# Patient Record
Sex: Male | Born: 1970 | Race: Black or African American | Hispanic: No | Marital: Married | Smoking: Former smoker
Health system: Southern US, Community
[De-identification: ages and names within clinical notes are randomized; demographics above are authoritative.]

## PROBLEM LIST (undated history)

## (undated) DIAGNOSIS — E119 Type 2 diabetes mellitus without complications: Secondary | ICD-10-CM

## (undated) HISTORY — PX: VASECTOMY: SHX75

## (undated) HISTORY — PX: EYE SURGERY: SHX253

---

## 2007-02-26 ENCOUNTER — Encounter: Admission: RE | Admit: 2007-02-26 | Discharge: 2007-02-26 | Payer: Self-pay | Admitting: *Deleted

## 2015-10-07 ENCOUNTER — Ambulatory Visit: Payer: Self-pay | Admitting: Endocrinology

## 2020-01-08 ENCOUNTER — Ambulatory Visit: Payer: Self-pay | Attending: Internal Medicine

## 2020-01-08 DIAGNOSIS — Z23 Encounter for immunization: Secondary | ICD-10-CM

## 2020-01-08 NOTE — Progress Notes (Signed)
   Covid-19 Vaccination Clinic  Name:  Angeles Paolucci    MRN: [id number] DOB: 04/09/1971  01/08/2020  Mr. Avakian was observed post Covid-19 immunization for 15 minutes without incident. He was provided with Vaccine Information Sheet and instruction to access the V-Safe system.   Mr. Bobier was instructed to call 911 with any severe reactions post vaccine: Marland Kitchen Difficulty breathing  . Swelling of face and throat  . A fast heartbeat  . A bad rash all over body  . Dizziness and weakness   Immunizations Administered    Name Date Dose VIS Date Route   Pfizer COVID-19 Vaccine 01/08/2020  3:04 PM 0.3 mL 09/26/2019 Intramuscular   Manufacturer: [employer], [employer]   Lot: [id number]   NDC: [address]-2

## 2020-01-20 ENCOUNTER — Encounter (INDEPENDENT_AMBULATORY_CARE_PROVIDER_SITE_OTHER): Payer: Self-pay | Admitting: Ophthalmology

## 2020-01-22 ENCOUNTER — Encounter (INDEPENDENT_AMBULATORY_CARE_PROVIDER_SITE_OTHER): Payer: Self-pay | Admitting: Ophthalmology

## 2020-01-22 ENCOUNTER — Ambulatory Visit (INDEPENDENT_AMBULATORY_CARE_PROVIDER_SITE_OTHER): Payer: BC Managed Care – PPO | Admitting: Ophthalmology

## 2020-01-22 ENCOUNTER — Other Ambulatory Visit: Payer: Self-pay

## 2020-01-22 DIAGNOSIS — H359 Unspecified retinal disorder: Secondary | ICD-10-CM | POA: Diagnosis not present

## 2020-01-22 DIAGNOSIS — E113411 Type 2 diabetes mellitus with severe nonproliferative diabetic retinopathy with macular edema, right eye: Secondary | ICD-10-CM | POA: Diagnosis not present

## 2020-01-22 DIAGNOSIS — H2513 Age-related nuclear cataract, bilateral: Secondary | ICD-10-CM | POA: Diagnosis not present

## 2020-01-22 DIAGNOSIS — E113412 Type 2 diabetes mellitus with severe nonproliferative diabetic retinopathy with macular edema, left eye: Secondary | ICD-10-CM | POA: Diagnosis not present

## 2020-01-22 MED ORDER — BEVACIZUMAB CHEMO INJECTION 1.25MG/0.05ML SYRINGE FOR KALEIDOSCOPE
1.2500 mg | INTRAVITREAL | Status: AC | PRN
  Administered 2020-01-22: 16:00:00 1.25 mg via INTRAVITREAL

## 2020-01-22 NOTE — Patient Instructions (Signed)
The nature of diabetic macular edema was discussed with the patient. Treatment options were outlined including medical therapy, laser & vitrectomy. The use of injectable medications reviewed, including Avastin, Lucentis, and Eylea. Periodic injections into the eye are likely to resolve diabetic macular edema (swelling in the center of vision). Initially, injections are delivered are delivered every 4-6 weeks, and the interval extended as the condition improves. On average, 8-9 injections the first year, and 5 in year 2. Improvement in the condition most often improves on medical therapy. Occasional use of focal laser is also recommended for residual macular edema (swelling). Excellent control of blood glucose and blood pressure are encouraged under the care of a primary physician or endocrinologist. Similarly, attempts to maintain serum cholesterol, low density lipoproteins, and high-density lipoproteins in a favorable range were recommended.  Diabetic Retinopathy Diabetic retinopathy is a disease of the retina. The retina is a light-sensitive membrane at the back of the eye. Retinopathy is a complication of diabetes (diabetes mellitus) and a common cause of bad eyesight (visual impairment). It can eventually cause blindness. Early detection and treatment of diabetic retinopathy is important in keeping your eyes healthy and preventing further damage to them. What are the causes? Diabetic retinopathy is caused by blood sugar (glucose) levels that are too high for an extended period of time. High blood glucose over an extended period of time can:  Damage small blood vessels in the retina, allowing blood to leak through the vessel walls.  Cause new, abnormal blood vessels to grow on the retina. This can scar the retina in the advanced stage of diabetic retinopathy. What increases the risk? You are more likely to develop this condition if:  You have had diabetes for a long time.  You have poorly  controlled blood glucose.  You have high blood pressure. What are the signs or symptoms? In the early stages of diabetic retinopathy, there are often no symptoms. As the condition gets worse, symptoms may include:  Blurred vision. This is usually caused by swelling due to abnormal blood glucose levels. The blurriness may go away when blood glucose levels return to normal.  Moving specks or dark spots (floaters) in your vision. These can be caused by a small amount of bleeding (hemorrhage) from retinal blood vessels.  Missing parts of your field of vision, such as vision at the sides of the eyes. This can be caused by larger retinal hemorrhages.  Difficulty reading.  Double vision.  Pain in one or both eyes.  Feeling pressure in one or both eyes.  Trouble seeing straight lines. Straight lines may not look straight.  Redness of the eyes that does not go away. How is this diagnosed? This condition may be diagnosed with an eye exam in which your eye care specialist puts drops in your eyes that enlarge (dilate) your pupils. This lets your health care provider examine your retina and check for changes in your retinal blood vessels. How is this treated? This condition may be treated by:  Keeping your blood glucose and blood pressure within a target range.  Using a type of laser beam to seal your retinal blood vessels. This stops them from bleeding and decreases pressure in your eye.  Getting shots of medicine in the eye to reduce swelling of the center of the retina (macula). You may be given: ? Anti-VEGF medicine. This medicine can help slow vision loss, and may even improve vision. ? Steroid medicine. Follow these instructions at home:   Follow your diabetes   management plan as directed by your health care provider. This may include exercising regularly and eating a healthy diet.  Keep your blood glucose level and your blood pressure in your target range, as directed by your health  care provider.  Check your blood glucose as often as directed.  Take over the counter and prescription medicines only as told by your health care provider. This includes insulin and oral diabetes medicine.  Get your eyes checked at least once every year. An eye specialist can usually see diabetic retinopathy developing long before it starts to cause problems. In many cases, it can be treated to prevent complications from occurring.  Do not use any products that contain nicotine or tobacco, such as cigarettes and e-cigarettes. If you need help quitting, ask your health care provider.  Keep all follow-up visits as told by your health care provider. This is important. Contact a health care provider if:  You notice gradual blurring or other changes in your vision over time.  You notice that your glasses or contact lenses do not make things look as sharp as they once did.  You have trouble reading or seeing details at a distance with either eye.  You notice a change in your vision or notice that parts of your field of vision appear missing or hazy.  You suddenly see moving specks or dark spots in the field of vision of either eye. Get help right away if:  You have sudden pain or pressure in one or both eyes.  You suddenly lose vision or a curtain or veil seems to come across your eyes.  You have a sudden burst of floaters in your vision. Summary  Diabetic retinopathy is a disease of the retina. The retina is a light-sensitive membrane at the back of the eye. Retinopathy is a complication of diabetes.  Get your eyes checked at least once every year. An eye specialist can usually see diabetic retinopathy developing long before it starts to cause problems. In many cases, it can be treated to prevent complications from occurring.  Keep your blood glucose and your blood pressure in target range. Follow your diabetes management plan as directed by your health care provider.  Protect your  eyes. Wear sunglasses and eye protection when needed. This information is not intended to replace advice given to you by your health care provider. Make sure you discuss any questions you have with your health care provider. Document Revised: 11/14/2017 Document Reviewed: 11/06/2016 Elsevier Patient Education  2020 Elsevier Inc.  

## 2020-01-22 NOTE — Progress Notes (Signed)
01/22/2020     CHIEF COMPLAINT Patient presents for Diabetic Eye Exam   HISTORY OF PRESENT ILLNESS: Eddie West is a 49 y.o. male who presents to the clinic today for:   HPI    Diabetic Eye Exam    Vision is blurred for distance and is blurred for near.  Associated Symptoms Floaters.  Diabetes characteristics include Type 2 and on insulin.  This started 4 months ago.  Blood sugar level is uncontrolled.  Last Blood Glucose 230 (Yesterday PM).  Last A1C: "high" pt does not recall.          Comments    4 Month Diabetic Exam OU  Pt c/o slightly decreased VA OU. Pt sts computer VA OU has become more difficult. Pt c/o intermittent floaters OU. No ocular pain or flashes of light OU.       Last edited by Rockie Neighbours, [place] on 01/22/2020  2:21 PM. (History)      Referring physician: No referring provider defined for this encounter.  HISTORICAL INFORMATION:   Selected notes from the MEDICAL RECORD NUMBER       CURRENT MEDICATIONS: No current outpatient medications on file. (Ophthalmic Drugs)   No current facility-administered medications for this visit. (Ophthalmic Drugs)   Current Outpatient Medications (Other)  Medication Sig  . atorvastatin (LIPITOR) 10 MG tablet Take by mouth.  . Glucagon (BAQSIMI ONE PACK) 3 MG/DOSE POWD Place into the nose.  . insulin aspart (NOVOLOG FLEXPEN) 100 UNIT/ML FlexPen Novolog Flexpen U-100 Insulin aspart 100 unit/mL (3 mL) subcutaneous  . insulin glargine (LANTUS SOLOSTAR) 100 UNIT/ML Solostar Pen Lantus Solostar U-100 Insulin 100 unit/mL (3 mL) subcutaneous pen  . amoxicillin-clavulanate (AUGMENTIN) 875-125 MG tablet amoxicillin 875 mg-potassium clavulanate 125 mg tablet  . ciprofloxacin (CIPRO) 500 MG tablet Cipro 500 mg tablet  Take 1 tablet every 12 hours by oral route for 10 days.  Marland Kitchen gabapentin (NEURONTIN) 100 MG capsule gabapentin 100 mg capsule  . losartan (COZAAR) 25 MG tablet Take 25 mg by mouth daily.  . metroNIDAZOLE  (FLAGYL) 500 MG tablet Flagyl 500 mg tablet  Take 1 tablet every 8 hours by oral route for 10 days.  . predniSONE (DELTASONE) 20 MG tablet prednisone 20 mg tablet  . traMADol (ULTRAM) 50 MG tablet tramadol 50 mg tablet   No current facility-administered medications for this visit. (Other)      REVIEW OF SYSTEMS: ROS    Positive for: Gastrointestinal   Last edited by Rockie Neighbours, COA on 01/22/2020  2:21 PM. (History)       ALLERGIES No Known Allergies  PAST MEDICAL HISTORY History reviewed. No pertinent past medical history. History reviewed. No pertinent surgical history.  FAMILY HISTORY History reviewed. No pertinent family history.  SOCIAL HISTORY Social History   Tobacco Use  . Smoking status: Never Smoker  . Smokeless tobacco: Never Used  Substance Use Topics  . Alcohol use: Not on file  . Drug use: Not on file         OPHTHALMIC EXAM:  Base Eye Exam    Visual Acuity (Snellen - Linear)      Right Left   Dist [place] 20/60 +2 20/50 -2   Dist ph [place] NI 20/30 -1       Tonometry (Tonopen, 2:29 PM)      Right Left   Pressure 16 18       Pupils      Pupils Dark Light Shape React APD   Right PERRL 5  4 Round Sluggish None   Left PERRL 5 4 Round Sluggish None       Visual Fields (Counting fingers)      Left Right     Full   Restrictions Total superior nasal deficiency        Extraocular Movement      Right Left    Full Full       Neuro/Psych    Oriented x3: Yes   Mood/Affect: Normal       Dilation    Both eyes: 1.0% Mydriacyl, 2.5% Phenylephrine @ 2:29 PM        Slit Lamp and Fundus Exam    Slit Lamp Exam      Right Left   Lids/Lashes Normal Normal   Conjunctiva/Sclera White and quiet White and quiet   Cornea Clear Clear   Anterior Chamber Deep and quiet Deep and quiet   Iris Round and reactive Round and reactive   Lens 1+ Nuclear sclerosis Clear   Vitreous Normal Normal       Fundus Exam      Right Left   Disc Normal Normal    C/D Ratio 0.4 0.45   Macula Clinically significant macular edema, Severe clinically significant macular edema, Exudates, Microaneurysms Clinically significant macular edema, Exudates, Microaneurysms   Vessels Very severe nonproliferative diabetic retinopathy Very severe nonproliferative diabetic retinopathy   Periphery Normal Normal          IMAGING AND PROCEDURES  Imaging and Procedures for @TODAY @  OCT, Retina - OU - Both Eyes       Right Eye Quality was good. Scan locations included subfoveal. Progression has worsened.   Left Eye Quality was good. Scan locations included subfoveal. Progression has improved.   Notes Macular thickening has increased with some subretinal fluid from diabetic macular edema OD  OS overall macular thickening has improved status post recent focal.       Intravitreal Injection, Pharmacologic Agent - OD - Right Eye       Time Out 01/22/2020. 4:15 PM. Confirmed correct patient, procedure, site, and patient consented.   Anesthesia Topical anesthesia was used. Anesthetic medications included Akten 3.5%.   Procedure Preparation included 5% betadine to ocular surface, 10% betadine to eyelids, Ofloxacin . A 30 gauge needle was used.   Injection:  1.25 mg Bevacizumab (AVASTIN) SOLN   NDC: 03/23/2020, [id number]   Route: Intravitreal, Site: Right Eye, Waste: 0 mg  Post-op Post injection exam found visual acuity of at least counting fingers. The patient tolerated the procedure well. There were no complications. The patient received written and verbal post procedure care education. Post injection medications were not given.                 ASSESSMENT/PLAN:  @PROBAPNOTE @    ICD-10-CM   1. Severe nonproliferative diabetic retinopathy of right eye, with macular edema, associated with type 2 diabetes mellitus (HCC)  E11.3411 Intravitreal Injection, Pharmacologic Agent - OD - Right Eye    Bevacizumab (AVASTIN) SOLN 1.25 mg  2. Severe  nonproliferative diabetic retinopathy of left eye, with macular edema, associated with type 2 diabetes mellitus (HCC)  E11.3412 OCT, Retina - OU - Both Eyes  3. Retinal exudates and deposits  H35.9   4. Nuclear sclerotic cataract of both eyes  H25.13     1.  2.  3.  Ophthalmic Meds Ordered this visit:  Meds ordered this encounter  Medications  . Bevacizumab (AVASTIN) SOLN 1.25 mg  Return in about 5 weeks (around 02/26/2020) for AVASTIN OCT, OD.  Patient Instructions   The nature of diabetic macular edema was discussed with the patient. Treatment options were outlined including medical therapy, laser & vitrectomy. The use of injectable medications reviewed, including Avastin, Lucentis, and Eylea. Periodic injections into the eye are likely to resolve diabetic macular edema (swelling in the center of vision). Initially, injections are delivered are delivered every 4-6 weeks, and the interval extended as the condition improves. On average, 8-9 injections the first year, and 5 in year 2. Improvement in the condition most often improves on medical therapy. Occasional use of focal laser is also recommended for residual macular edema (swelling). Excellent control of blood glucose and blood pressure are encouraged under the care of a primary physician or endocrinologist. Similarly, attempts to maintain serum cholesterol, low density lipoproteins, and high-density lipoproteins in a favorable range were recommended. Diabetic Retinopathy Diabetic retinopathy is a disease of the retina. The retina is a light-sensitive membrane at the back of the eye. Retinopathy is a complication of diabetes (diabetes mellitus) and a common cause of bad eyesight (visual impairment). It can eventually cause blindness. Early detection and treatment of diabetic retinopathy is important in keeping your eyes healthy and preventing further damage to them. What are the causes? Diabetic retinopathy is caused by blood sugar  (glucose) levels that are too high for an extended period of time. High blood glucose over an extended period of time can:  Damage small blood vessels in the retina, allowing blood to leak through the vessel walls.  Cause new, abnormal blood vessels to grow on the retina. This can scar the retina in the advanced stage of diabetic retinopathy. What increases the risk? You are more likely to develop this condition if:  You have had diabetes for a long time.  You have poorly controlled blood glucose.  You have high blood pressure. What are the signs or symptoms? In the early stages of diabetic retinopathy, there are often no symptoms. As the condition gets worse, symptoms may include:  Blurred vision. This is usually caused by swelling due to abnormal blood glucose levels. The blurriness may go away when blood glucose levels return to normal.  Moving specks or dark spots (floaters) in your vision. These can be caused by a small amount of bleeding (hemorrhage) from retinal blood vessels.  Missing parts of your field of vision, such as vision at the sides of the eyes. This can be caused by larger retinal hemorrhages.  Difficulty reading.  Double vision.  Pain in one or both eyes.  Feeling pressure in one or both eyes.  Trouble seeing straight lines. Straight lines may not look straight.  Redness of the eyes that does not go away. How is this diagnosed? This condition may be diagnosed with an eye exam in which your eye care specialist puts drops in your eyes that enlarge (dilate) your pupils. This lets your health care provider examine your retina and check for changes in your retinal blood vessels. How is this treated? This condition may be treated by:  Keeping your blood glucose and blood pressure within a target range.  Using a type of laser beam to seal your retinal blood vessels. This stops them from bleeding and decreases pressure in your eye.  Getting shots of medicine in  the eye to reduce swelling of the center of the retina (macula). You may be given: ? Anti-VEGF medicine. This medicine can help slow vision loss, and may  even improve vision. ? Steroid medicine. Follow these instructions at home:   Follow your diabetes management plan as directed by your health care provider. This may include exercising regularly and eating a healthy diet.  Keep your blood glucose level and your blood pressure in your target range, as directed by your health care provider.  Check your blood glucose as often as directed.  Take over the counter and prescription medicines only as told by your health care provider. This includes insulin and oral diabetes medicine.  Get your eyes checked at least once every year. An eye specialist can usually see diabetic retinopathy developing long before it starts to cause problems. In many cases, it can be treated to prevent complications from occurring.  Do not use any products that contain nicotine or tobacco, such as cigarettes and e-cigarettes. If you need help quitting, ask your health care provider.  Keep all follow-up visits as told by your health care provider. This is important. Contact a health care provider if:  You notice gradual blurring or other changes in your vision over time.  You notice that your glasses or contact lenses do not make things look as sharp as they once did.  You have trouble reading or seeing details at a distance with either eye.  You notice a change in your vision or notice that parts of your field of vision appear missing or hazy.  You suddenly see moving specks or dark spots in the field of vision of either eye. Get help right away if:  You have sudden pain or pressure in one or both eyes.  You suddenly lose vision or a curtain or veil seems to come across your eyes.  You have a sudden burst of floaters in your vision. Summary  Diabetic retinopathy is a disease of the retina. The retina is a  light-sensitive membrane at the back of the eye. Retinopathy is a complication of diabetes.  Get your eyes checked at least once every year. An eye specialist can usually see diabetic retinopathy developing long before it starts to cause problems. In many cases, it can be treated to prevent complications from occurring.  Keep your blood glucose and your blood pressure in target range. Follow your diabetes management plan as directed by your health care provider.  Protect your eyes. Wear sunglasses and eye protection when needed. This information is not intended to replace advice given to you by your health care provider. Make sure you discuss any questions you have with your health care provider. Document Revised: 11/14/2017 Document Reviewed: 11/06/2016 Elsevier Patient Education  2020 [employer].     Explained the diagnoses, plan, and follow up with the patient and they expressed understanding.  Patient expressed understanding of the importance of proper follow up care.   Alford Highland Aracelys Glade M.D. Diseases & Surgery of the Retina and Vitreous [facility] @TODAY @     Abbreviations: M myopia (nearsighted); A astigmatism; H hyperopia (farsighted); P presbyopia; Mrx spectacle prescription;  CTL contact lenses; OD right eye; OS left eye; OU both eyes  XT exotropia; ET esotropia; PEK punctate epithelial keratitis; PEE punctate epithelial erosions; DES dry eye syndrome; MGD meibomian gland dysfunction; ATs artificial tears; PFAT's preservative free artificial tears; NSC nuclear sclerotic cataract; PSC posterior subcapsular cataract; ERM epi-retinal membrane; PVD posterior vitreous detachment; RD retinal detachment; DM diabetes mellitus; DR diabetic retinopathy; NPDR non-proliferative diabetic retinopathy; PDR proliferative diabetic retinopathy; CSME clinically significant macular edema; DME diabetic macular edema; dbh dot blot  hemorrhages; CWS cotton wool spot; POAG primary open  angle glaucoma; C/D cup-to-disc ratio; HVF humphrey visual field; GVF goldmann visual field; OCT optical coherence tomography; IOP intraocular pressure; BRVO Branch retinal vein occlusion; CRVO central retinal vein occlusion; CRAO central retinal artery occlusion; BRAO branch retinal artery occlusion; RT retinal tear; SB scleral buckle; PPV pars plana vitrectomy; VH Vitreous hemorrhage; PRP panretinal laser photocoagulation; IVK intravitreal kenalog; VMT vitreomacular traction; MH Macular hole;  NVD neovascularization of the disc; NVE neovascularization elsewhere; AREDS age related eye disease study; ARMD age related macular degeneration; POAG primary open angle glaucoma; EBMD epithelial/anterior basement membrane dystrophy; ACIOL anterior chamber intraocular lens; IOL intraocular lens; PCIOL posterior chamber intraocular lens; Phaco/IOL phacoemulsification with intraocular lens placement; PRK photorefractive keratectomy; LASIK laser assisted in situ keratomileusis; HTN hypertension; DM diabetes mellitus; COPD chronic obstructive pulmonary disease

## 2020-02-02 ENCOUNTER — Ambulatory Visit: Payer: BC Managed Care – PPO | Attending: Internal Medicine

## 2020-02-02 DIAGNOSIS — Z23 Encounter for immunization: Secondary | ICD-10-CM

## 2020-02-02 NOTE — Progress Notes (Signed)
   Covid-19 Vaccination Clinic  Name:  Hawkin Charo    MRN: [id number] DOB: Jun 11, 1971  02/02/2020  Mr. Beitler was observed post Covid-19 immunization for 15 minutes without incident. He was provided with Vaccine Information Sheet and instruction to access the V-Safe system.   Mr. Geesey was instructed to call 911 with any severe reactions post vaccine: Marland Kitchen Difficulty breathing  . Swelling of face and throat  . A fast heartbeat  . A bad rash all over body  . Dizziness and weakness   Immunizations Administered    Name Date Dose VIS Date Route   Pfizer COVID-19 Vaccine 02/02/2020  3:05 PM 0.3 mL 12/10/2018 Intramuscular   Manufacturer: [employer], [employer]   Lot: [id number]   NDC: [address]-7

## 2020-02-26 ENCOUNTER — Encounter (INDEPENDENT_AMBULATORY_CARE_PROVIDER_SITE_OTHER): Payer: BC Managed Care – PPO | Admitting: Ophthalmology

## 2020-03-30 ENCOUNTER — Encounter (INDEPENDENT_AMBULATORY_CARE_PROVIDER_SITE_OTHER): Payer: Self-pay | Admitting: Ophthalmology

## 2020-03-30 ENCOUNTER — Encounter (INDEPENDENT_AMBULATORY_CARE_PROVIDER_SITE_OTHER): Payer: Self-pay

## 2020-06-26 ENCOUNTER — Emergency Department (HOSPITAL_COMMUNITY)
Admission: EM | Admit: 2020-06-26 | Discharge: 2020-06-27 | Disposition: A | Discharge status: Home / Self Care | Payer: BC Managed Care – PPO | Attending: Emergency Medicine | Admitting: Emergency Medicine

## 2020-06-26 ENCOUNTER — Emergency Department (HOSPITAL_COMMUNITY): Payer: BC Managed Care – PPO

## 2020-06-26 ENCOUNTER — Encounter (HOSPITAL_COMMUNITY): Payer: Self-pay

## 2020-06-26 DIAGNOSIS — Y92410 Unspecified street and highway as the place of occurrence of the external cause: Secondary | ICD-10-CM | POA: Insufficient documentation

## 2020-06-26 DIAGNOSIS — Z5321 Procedure and treatment not carried out due to patient leaving prior to being seen by health care provider: Secondary | ICD-10-CM | POA: Insufficient documentation

## 2020-06-26 DIAGNOSIS — M25571 Pain in right ankle and joints of right foot: Secondary | ICD-10-CM | POA: Diagnosis present

## 2020-06-26 DIAGNOSIS — Y939 Activity, unspecified: Secondary | ICD-10-CM | POA: Insufficient documentation

## 2020-06-26 DIAGNOSIS — M25572 Pain in left ankle and joints of left foot: Secondary | ICD-10-CM | POA: Insufficient documentation

## 2020-06-26 DIAGNOSIS — Y999 Unspecified external cause status: Secondary | ICD-10-CM | POA: Diagnosis not present

## 2020-06-26 HISTORY — DX: Type 2 diabetes mellitus without complications: E11.9
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
## 2020-06-27 NOTE — ED Notes (Signed)
Pt did not want to stay and left . 
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
## 2022-10-03 ENCOUNTER — Other Ambulatory Visit: Payer: Self-pay | Admitting: *Deleted

## 2022-10-03 DIAGNOSIS — M79604 Pain in right leg: Secondary | ICD-10-CM

## 2022-10-10 ENCOUNTER — Encounter: Payer: Self-pay | Admitting: *Deleted

## 2022-10-12 ENCOUNTER — Encounter (HOSPITAL_COMMUNITY): Payer: BC Managed Care – PPO

## 2022-10-12 ENCOUNTER — Encounter: Payer: BC Managed Care – PPO | Admitting: Vascular Surgery

## 2022-10-23 NOTE — Progress Notes (Unsigned)
[facility] [place]  ASSESSMENT / PLAN: 52 y.o. male with ulceration of the right lower extremity in the setting of poorly controlled diabetes mellitus.  His clinical exam and noninvasive testing are reassuring.  He is already healing these ulcers with local wound care.  I have encouraged him to continue meticulous wound care.  He needs better control of his diabetes.  I counseled him about the risk of uncontrolled diabetes on diabetic foot infection, renal insufficiency, etc.  He can follow-up with me on an as-needed basis  CHIEF COMPLAINT: Ulceration of right lower extremity  HISTORY OF PRESENT ILLNESS: Eddie West is a 52 y.o. male who presents to clinic for evaluation of right lower extremity ulceration.  These ulcers developed after significant lower extremity swelling which caused blistering.  His swelling has been corrected medically.  He is now undergoing local wound care.  He is referred for further evaluation given his history of poorly controlled diabetes mellitus.   Past Medical History:  Diagnosis Date   Diabetes mellitus without complication ([place])     Past Surgical History:  Procedure Laterality Date   EYE SURGERY     VASECTOMY      History reviewed. No pertinent family history.  Social History   Socioeconomic History   Marital status: Married    Spouse name: Not on file   Number of children: Not on file   Years of education: Not on file   Highest education level: Not on file  Occupational History   Not on file  Tobacco Use   Smoking status: Former    Types: Cigarettes    Quit date: 2018    Years since quitting: 6.0   Smokeless tobacco: Never  Substance and Sexual Activity   Alcohol use: Not Currently   Drug use: Not Currently   Sexual activity: Not on file  Other Topics Concern   Not on file  Social History Narrative   Not on file   Social Determinants of Health   Financial Resource Strain: Not on file  Food  Insecurity: Not on file  Transportation Needs: Not on file  Physical Activity: Not on file  Stress: Not on file  Social Connections: Not on file  Intimate Partner Violence: Not on file    No Known Allergies  Current Outpatient Medications  Medication Sig Dispense Refill   amoxicillin-clavulanate (AUGMENTIN) 875-125 MG tablet amoxicillin 875 mg-potassium clavulanate 125 mg tablet     Dulaglutide 1.5 MG/0.5ML SOPN Inject into the skin.     empagliflozin (JARDIANCE) 25 MG TABS tablet Take by mouth.     gabapentin (NEURONTIN) 100 MG capsule gabapentin 100 mg capsule     Glucagon (BAQSIMI ONE PACK) 3 MG/DOSE POWD Place into the nose.     insulin aspart (NOVOLOG FLEXPEN) 100 UNIT/ML FlexPen Novolog Flexpen U-100 Insulin aspart 100 unit/mL (3 mL) subcutaneous     insulin glargine (LANTUS SOLOSTAR) 100 UNIT/ML Solostar Pen Lantus Solostar U-100 Insulin 100 unit/mL (3 mL) subcutaneous pen     losartan (COZAAR) 25 MG tablet Take 25 mg by mouth daily.     metoprolol tartrate (LOPRESSOR) 25 MG tablet Take by mouth.     rosuvastatin (CRESTOR) 40 MG tablet Take by mouth.     ciprofloxacin (CIPRO) 500 MG tablet Cipro 500 mg tablet  Take 1 tablet every 12 hours by oral route for 10 days. (Patient not taking: Reported on 10/24/2022)     metroNIDAZOLE (FLAGYL) 500 MG tablet Flagyl 500 mg tablet  Take 1 tablet every 8 hours by oral route for 10 days. (Patient not taking: Reported on 10/24/2022)     No current facility-administered medications for this visit.    PHYSICAL EXAM Vitals:   10/24/22 1119  BP: (!) 141/91  Pulse: 80  Resp: 20  Temp: 98.6 F (37 C)  SpO2: 99%  Weight: 197 lb (89.4 kg)  Height: 5\' 10"  (1.778 m)   Well-appearing man in no acute distress Regular rate and rhythm Unlabored breathing 2+ dorsalis pedis pulses bilaterally Healing ulceration scattered across the dorsum of the right foot and the anterior aspect of the lower leg.   PERTINENT LABORATORY AND RADIOLOGIC  DATA  +-------+-----------+-----------+------------+------------+  ABI/TBIToday's ABIToday's TBIPrevious ABIPrevious TBI  +-------+-----------+-----------+------------+------------+  Right 1.24       0.92                                 +-------+-----------+-----------+------------+------------+  Left  1.07       0.93                                 +-------+-----------+-----------+------------+------------+   . Rande Brunt, MD [facility] Phone Number: [phone] 10/24/2022 2:33 PM   Total time spent on preparing this encounter including chart review, data review, collecting history, examining the patient, coordinating care for this new patient, 45 minutes.  Portions of this report may have been transcribed using voice recognition software.  Every effort has been made to ensure accuracy; however, inadvertent computerized transcription errors may still be present.

## 2022-10-24 ENCOUNTER — Ambulatory Visit (HOSPITAL_COMMUNITY)
Admission: RE | Admit: 2022-10-24 | Discharge: 2022-10-24 | Disposition: A | Discharge status: Home / Self Care | Payer: BC Managed Care – PPO | Source: Ambulatory Visit | Attending: Vascular Surgery | Admitting: Vascular Surgery

## 2022-10-24 ENCOUNTER — Encounter: Payer: Self-pay | Admitting: Vascular Surgery

## 2022-10-24 ENCOUNTER — Ambulatory Visit (INDEPENDENT_AMBULATORY_CARE_PROVIDER_SITE_OTHER): Payer: BC Managed Care – PPO | Admitting: Vascular Surgery

## 2022-10-24 VITALS — BP 141/91 | HR 80 | Temp 98.6°F | Resp 20 | Ht 70.0 in | Wt 197.0 lb

## 2022-10-24 DIAGNOSIS — L97919 Non-pressure chronic ulcer of unspecified part of right lower leg with unspecified severity: Secondary | ICD-10-CM

## 2022-10-24 DIAGNOSIS — M79605 Pain in left leg: Secondary | ICD-10-CM | POA: Diagnosis not present

## 2022-10-24 DIAGNOSIS — M79604 Pain in right leg: Secondary | ICD-10-CM | POA: Insufficient documentation

## 2022-10-25 LAB — VAS US ABI WITH/WO TBI
Left ABI: 1.07
Right ABI: 1.24

## 2022-11-16 DEATH — deceased

## 2022-12-12 ENCOUNTER — Ambulatory Visit: Payer: BC Managed Care – PPO | Admitting: Dietician
